# Patient Record
Sex: Female | Born: 1970 | Race: White | Hispanic: No | Smoking: Current every day smoker
Health system: Southern US, Community
[De-identification: ages and names within clinical notes are randomized; demographics above are authoritative.]

## PROBLEM LIST (undated history)

## (undated) DIAGNOSIS — M419 Scoliosis, unspecified: Secondary | ICD-10-CM

## (undated) DIAGNOSIS — M797 Fibromyalgia: Secondary | ICD-10-CM

---

## 2016-10-12 ENCOUNTER — Encounter (HOSPITAL_COMMUNITY): Payer: Self-pay

## 2016-10-12 ENCOUNTER — Emergency Department (HOSPITAL_COMMUNITY): Payer: Medicaid - Out of State

## 2016-10-12 DIAGNOSIS — S299XXA Unspecified injury of thorax, initial encounter: Secondary | ICD-10-CM | POA: Diagnosis present

## 2016-10-12 DIAGNOSIS — Y92000 Kitchen of unspecified non-institutional (private) residence as  the place of occurrence of the external cause: Secondary | ICD-10-CM | POA: Diagnosis not present

## 2016-10-12 DIAGNOSIS — F17200 Nicotine dependence, unspecified, uncomplicated: Secondary | ICD-10-CM | POA: Insufficient documentation

## 2016-10-12 DIAGNOSIS — Y999 Unspecified external cause status: Secondary | ICD-10-CM | POA: Insufficient documentation

## 2016-10-12 DIAGNOSIS — W07XXXA Fall from chair, initial encounter: Secondary | ICD-10-CM | POA: Diagnosis not present

## 2016-10-12 DIAGNOSIS — S2241XA Multiple fractures of ribs, right side, initial encounter for closed fracture: Secondary | ICD-10-CM | POA: Diagnosis not present

## 2016-10-12 DIAGNOSIS — Y9389 Activity, other specified: Secondary | ICD-10-CM | POA: Diagnosis not present

## 2016-10-12 NOTE — ED Triage Notes (Signed)
Pt states she was on a chair reaching up to her cabinets when the chair tipped over and she fell against a counter.  Pt fell against her right ribcage.

## 2016-10-13 ENCOUNTER — Emergency Department (HOSPITAL_COMMUNITY): Payer: Medicaid - Out of State

## 2016-10-13 ENCOUNTER — Emergency Department (HOSPITAL_COMMUNITY)
Admission: EM | Admit: 2016-10-13 | Discharge: 2016-10-13 | Disposition: A | Discharge status: Home / Self Care | Payer: Medicaid - Out of State | Attending: Emergency Medicine | Admitting: Emergency Medicine

## 2016-10-13 DIAGNOSIS — S2241XA Multiple fractures of ribs, right side, initial encounter for closed fracture: Secondary | ICD-10-CM

## 2016-10-13 HISTORY — DX: Scoliosis, unspecified: M41.9

## 2016-10-13 HISTORY — DX: Fibromyalgia: M79.7

## 2016-10-13 MED ORDER — OXYCODONE-ACETAMINOPHEN 5-325 MG PO TABS
1.0000 | ORAL_TABLET | Freq: Once | ORAL | Status: AC
  Administered 2016-10-13: 1 via ORAL
  Filled 2016-10-13: qty 1

## 2016-10-13 MED ORDER — OXYCODONE-ACETAMINOPHEN 5-325 MG PO TABS: 1.0000 | ORAL_TABLET | ORAL | 0 refills | Status: AC | PRN

## 2016-10-13 NOTE — ED Provider Notes (Signed)
[facility] Provider Note   CSN: [id number] Arrival date & time: 10/12/16  2253     History   Chief Complaint Chief Complaint  Patient presents with  . Rib Injury    HPI Adrienne Dyer is a 46 y.o. female presenting for evaluation of right chest wall pain which occurred suddenly when she fell off a chair while trying to reach a bowl on a top shelf in her kitchen cupboard.  The fall occurred prior to arrival tonight, hitting her right chest wall on the counter edge, then landing on her buttocks on the floor. She denies head injury or loc. She has chronic neck and back pain and her neck is hurting more since the fall. She denies numbness, weakness in her extremities, also denies sob except for pain limiting her desire to take a deep breath. She has had no medicines prior to arrival here. HPI  Past Medical History:  Diagnosis Date  . Fibromyalgia   . Scoliosis     There are no active problems to display for this patient.   History reviewed. No pertinent surgical history.  OB History    No data available       Home Medications    Prior to Admission medications   Medication Sig Start Date End Date Taking? Authorizing Provider  oxyCODONE-acetaminophen (PERCOCET/ROXICET) 5-325 MG tablet Take 1 tablet by mouth every 4 (four) hours as needed. 10/13/16   Burgess Amor, PA-C    Family History No family history on file.  Social History Social History  Substance Use Topics  . Smoking status: Current Every Day Smoker  . Smokeless tobacco: Never Used  . Alcohol use Yes     Allergies   Cephalexin   Review of Systems Review of Systems  HENT: Negative.   Eyes: Negative for visual disturbance.  Respiratory: Negative for shortness of breath.   Cardiovascular: Positive for chest pain.  Gastrointestinal: Negative for nausea and vomiting.  Musculoskeletal: Positive for arthralgias. Negative for joint swelling and myalgias.  Skin: Negative.   Neurological: Negative for  dizziness, weakness, numbness and headaches.     Physical Exam Updated Vital Signs BP 129/69 (BP Location: Right Arm)   Pulse 91   Temp 98.2 F (36.8 C) (Oral)   Resp 20   Ht 5\' 2"  (1.575 m)   Wt 64.9 kg (143 lb)   SpO2 100%   BMI 26.16 kg/m   Physical Exam  Constitutional: She appears well-developed and well-nourished.  HENT:  Head: Normocephalic and atraumatic.  Eyes: Conjunctivae are normal.  Neck: Normal range of motion. Neck supple. Spinous process tenderness present.  ttp right lateral cervical spine at the C6-7 level.  Right trapezius muscle spasm noted.  Cardiovascular: Normal rate, regular rhythm, normal heart sounds and intact distal pulses.   Pulmonary/Chest: Effort normal and breath sounds normal. No respiratory distress. She has no decreased breath sounds. She has no wheezes. She has no rhonchi. She has no rales. She exhibits no swelling.    Abdominal: Soft. Bowel sounds are normal. There is no tenderness.  Musculoskeletal: Normal range of motion.  Neurological: She is alert.  Skin: Skin is warm and dry.  Psychiatric: She has a normal mood and affect.  Nursing note and vitals reviewed.    ED Treatments / Results  Labs (all labs ordered are listed, but only abnormal results are displayed) Labs Reviewed - No data to display  EKG  EKG Interpretation None       Radiology Dg Ribs Unilateral W/chest  Right  Result Date: 10/12/2016 CLINICAL DATA:  Status post fall toward right side onto edge of kitchen sink, with right lower rib pain. Initial encounter. EXAM: RIGHT RIBS AND CHEST - 3+ VIEW COMPARISON:  None. FINDINGS: There appear to be mildly displaced fractures of the right anterolateral fourth through sixth ribs. The lungs are well-aerated and clear. There is no evidence of focal opacification, pleural effusion or pneumothorax. The cardiomediastinal silhouette is within normal limits. No additional osseous abnormalities are seen. Thoracic spinal fusion  hardware is noted. IMPRESSION: 1. Apparent mildly displaced fractures of the right anterolateral fourth through sixth ribs. 2. No acute cardiopulmonary process seen. Electronically Signed   By: Roanna RaiderJeffery  Chang M.D.   On: 10/12/2016 23:58   Dg Cervical Spine Complete  Result Date: 10/13/2016 CLINICAL DATA:  Initial evaluation for acute trauma, fall. EXAM: CERVICAL SPINE - COMPLETE 4+ VIEW COMPARISON:  None. FINDINGS: Straightening with slight reversal of the normal cervical lordosis. No listhesis. Vertebral body heights maintained. Tiny osseous density at the anterior aspect of the inferior plate of C5 demonstrates corticated margins, and is felt to be chronic in nature. No acute fracture. Normal C1-2 articulations preserved in the dens is intact. No significant degenerative changes within the cervical spine. Prevertebral soft tissues normal. Harrington fixation rods partially visualize within the upper thoracic spine. Visualized lung apices are clear. IMPRESSION: 1. No radiographic evidence for acute traumatic injury within cervical spine. 2. Tiny osseous density at the anterior aspect of the inferior endplate of C5 demonstrates corticated margins, and is felt to be chronic/degenerative in nature. Electronically Signed   By: Rise MuBenjamin  McClintock M.D.   On: 10/13/2016 01:55    Procedures Procedures (including critical care time)  Medications Ordered in ED Medications  oxyCODONE-acetaminophen (PERCOCET/ROXICET) 5-325 MG per tablet 1 tablet (1 tablet Oral Given 10/13/16 0137)     Initial Impression / Assessment and Plan / ED Course  I have reviewed the triage vital signs and the nursing notes.  Pertinent labs & imaging results that were available during my care of the patient were reviewed by me and considered in my medical decision making (see chart for details).     [place] and [facility] narcotic databases reviewed with no concerning narcotic abuse pattern found.  She was placed on oxycodone for pain sx  associated with injury.  Encouraged continued ibuprofen which she has. activities as tolerated Incentive spirometer.  F/u with pcp for a recheck for any probs/pain/ sob/fevers.   Final Clinical Impressions(s) / ED Diagnoses   Final diagnoses:  Closed fracture of multiple ribs of right side, initial encounter    New Prescriptions New Prescriptions   OXYCODONE-ACETAMINOPHEN (PERCOCET/ROXICET) 5-325 MG TABLET    Take 1 tablet by mouth every 4 (four) hours as needed.     Burgess Amordol, Luccia Reinheimer, PA-C 10/13/16 [id number]    Devoria AlbeKnapp, Iva, MD 10/13/16 [phone]

## 2016-10-13 NOTE — Discharge Instructions (Signed)
Use the incentive spirometer as instructed to help minimize the risk of developing pneumonia.  You may take the oxycodone prescribed for pain relief.  This will make you drowsy - do not drive within 4 hours of taking this medication.  Get rechecked for any worsening pain, shortness of breath, fevers, dizziness or weakness.  Your injury will probably take 4-6 weeks to heal completely.

## 2016-10-20 ENCOUNTER — Encounter (HOSPITAL_COMMUNITY): Payer: Self-pay | Admitting: *Deleted

## 2016-10-20 ENCOUNTER — Emergency Department (HOSPITAL_COMMUNITY)
Admission: EM | Admit: 2016-10-20 | Discharge: 2016-10-20 | Disposition: A | Discharge status: Home / Self Care | Payer: Medicaid - Out of State | Attending: Emergency Medicine | Admitting: Emergency Medicine

## 2016-10-20 DIAGNOSIS — W1839XD Other fall on same level, subsequent encounter: Secondary | ICD-10-CM | POA: Insufficient documentation

## 2016-10-20 DIAGNOSIS — S299XXD Unspecified injury of thorax, subsequent encounter: Secondary | ICD-10-CM | POA: Diagnosis present

## 2016-10-20 DIAGNOSIS — F17200 Nicotine dependence, unspecified, uncomplicated: Secondary | ICD-10-CM | POA: Insufficient documentation

## 2016-10-20 DIAGNOSIS — S2241XD Multiple fractures of ribs, right side, subsequent encounter for fracture with routine healing: Secondary | ICD-10-CM | POA: Diagnosis not present

## 2016-10-20 MED ORDER — HYDROCODONE-ACETAMINOPHEN 7.5-325 MG PO TABS: 1.0000 | ORAL_TABLET | Freq: Four times a day (QID) | ORAL | 0 refills | Status: AC | PRN

## 2016-10-20 NOTE — ED Provider Notes (Signed)
[facility] Provider Note   CSN: [id number] Arrival date & time: 10/20/16  1647     History   Chief Complaint Chief Complaint  Patient presents with  . Rib Fracture    HPI Adrienne Dyer is a 46 y.o. female.  HPI   Adrienne Dyer is a 46 y.o. female who presents to the Emergency Department complaining of persistent right rib pain.  She was seen here last week and treated for same after a mechanical fall landing on the edge of a counter.  She reports continued pain associated with movement, cough, laughing and deep breathing.  Pain improves at rest.  She was prescribed pain medication, but ran out for the medication one day prior.  Pain unrelieved with OTC analgesics.  She denies shortness of breath, abdominal pain, or fever.    Past Medical History:  Diagnosis Date  . Fibromyalgia   . Scoliosis     There are no active problems to display for this patient.   History reviewed. No pertinent surgical history.  OB History    No data available       Home Medications    Prior to Admission medications   Medication Sig Start Date End Date Taking? Authorizing Provider  oxyCODONE-acetaminophen (PERCOCET/ROXICET) 5-325 MG tablet Take 1 tablet by mouth every 4 (four) hours as needed. 10/13/16   Burgess AmorIdol, Julie, PA-C    Family History No family history on file.  Social History Social History  Substance Use Topics  . Smoking status: Current Every Day Smoker  . Smokeless tobacco: Never Used  . Alcohol use Yes     Allergies   Cephalexin   Review of Systems Review of Systems  Constitutional: Negative for chills and fever.  Respiratory: Negative for cough, chest tightness and shortness of breath.   Cardiovascular: Positive for chest pain (right rib pain).  Gastrointestinal: Negative for abdominal distention, abdominal pain and vomiting.  Genitourinary: Negative for difficulty urinating, dysuria, flank pain and hematuria.  Musculoskeletal: Negative for arthralgias,  joint swelling and neck pain.  Skin: Negative for color change and wound.  All other systems reviewed and are negative.    Physical Exam Updated Vital Signs BP (!) 113/59   Pulse 70   Temp 98.8 F (37.1 C)   Resp 20   Ht 5\' 2"  (1.575 m)   Wt 64.9 kg (143 lb)   SpO2 99%   BMI 26.16 kg/m   Physical Exam  Constitutional: She is oriented to person, place, and time. She appears well-developed and well-nourished. No distress.  HENT:  Head: Atraumatic.  Neck: Normal range of motion.  Cardiovascular: Normal rate, regular rhythm, normal heart sounds and intact distal pulses.   Pulmonary/Chest: Effort normal and breath sounds normal. No respiratory distress. She exhibits tenderness (ttp of the lateral right ribs.  no crepitus. ).  Abdominal: Soft. She exhibits no distension. There is no tenderness.  Musculoskeletal: Normal range of motion.  Neurological: She is alert and oriented to person, place, and time.  Skin: Skin is warm and dry. Capillary refill takes less than 2 seconds.  Psychiatric: She has a normal mood and affect.  Nursing note and vitals reviewed.    ED Treatments / Results  Labs (all labs ordered are listed, but only abnormal results are displayed) Labs Reviewed - No data to display  EKG  EKG Interpretation None       Radiology No results found.  Procedures Procedures (including critical care time)  Medications Ordered in ED Medications - No  data to display   Initial Impression / Assessment and Plan / ED Course  I have reviewed the triage vital signs and the nursing notes.  Pertinent labs & imaging results that were available during my care of the patient were reviewed by me and considered in my medical decision making (see chart for details).     Pt reviewed on the [facility].   Pt with documented rib fx's from 10/12/16. Vitals stable.  Lungs clear.  Vitals stable.  Pt appears stable for d/c.  Will provide a short course of pain medication and she  understands that will need to f/u with PCP for further management.  Pt agrees to plan..  Final Clinical Impressions(s) / ED Diagnoses   Final diagnoses:  Closed fracture of multiple ribs of right side with routine healing, subsequent encounter    New Prescriptions Discharge Medication List as of 10/20/2016  6:00 PM    START taking these medications   Details  HYDROcodone-acetaminophen (NORCO) 7.5-325 MG tablet Take 1 tablet by mouth every 6 (six) hours as needed for moderate pain., Starting Thu 10/20/2016, Print         Willella Harding, PA-C 10/22/16 2331    Samuel Jester, DO 10/24/16 2102

## 2016-10-20 NOTE — Discharge Instructions (Signed)
Continue to take your ibuprofen 800 mg as directed.  Also continue using your incentive spirometer several times a day.  Follow-up with your doctor for recheck

## 2016-10-20 NOTE — ED Triage Notes (Signed)
Seen her last week and diagnosed with rib fracture, here for pain medication

## 2016-10-26 ENCOUNTER — Encounter (HOSPITAL_COMMUNITY): Payer: Self-pay | Admitting: Emergency Medicine

## 2016-10-26 ENCOUNTER — Emergency Department (HOSPITAL_COMMUNITY)
Admission: EM | Admit: 2016-10-26 | Discharge: 2016-10-26 | Disposition: A | Discharge status: Home / Self Care | Payer: Medicaid - Out of State | Attending: Emergency Medicine | Admitting: Emergency Medicine

## 2016-10-26 DIAGNOSIS — S2241XD Multiple fractures of ribs, right side, subsequent encounter for fracture with routine healing: Secondary | ICD-10-CM

## 2016-10-26 DIAGNOSIS — S0003XA Contusion of scalp, initial encounter: Secondary | ICD-10-CM | POA: Diagnosis not present

## 2016-10-26 DIAGNOSIS — Y939 Activity, unspecified: Secondary | ICD-10-CM | POA: Diagnosis not present

## 2016-10-26 DIAGNOSIS — F17210 Nicotine dependence, cigarettes, uncomplicated: Secondary | ICD-10-CM | POA: Diagnosis not present

## 2016-10-26 DIAGNOSIS — Y999 Unspecified external cause status: Secondary | ICD-10-CM | POA: Insufficient documentation

## 2016-10-26 DIAGNOSIS — W228XXA Striking against or struck by other objects, initial encounter: Secondary | ICD-10-CM | POA: Diagnosis not present

## 2016-10-26 DIAGNOSIS — Y92014 Private driveway to single-family (private) house as the place of occurrence of the external cause: Secondary | ICD-10-CM | POA: Diagnosis not present

## 2016-10-26 MED ORDER — IBUPROFEN 800 MG PO TABS: 800.0000 mg | ORAL_TABLET | Freq: Three times a day (TID) | ORAL | 0 refills | Status: AC

## 2016-10-26 MED ORDER — IBUPROFEN 800 MG PO TABS
800.0000 mg | ORAL_TABLET | Freq: Once | ORAL | Status: AC
  Administered 2016-10-26: 800 mg via ORAL
  Filled 2016-10-26: qty 1
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
## 2016-10-26 NOTE — Discharge Instructions (Signed)
Apply ice packs on/off to your scalp.  Follow-up with your primary doctor for recheck
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
## 2016-10-26 NOTE — ED Notes (Signed)
Ice pack given to pt.

## 2018-12-20 IMAGING — DX DG CERVICAL SPINE COMPLETE 4+V
5 series · 5 of 5 positions shown · non-contrast
Comparison: None.

CLINICAL DATA: Initial evaluation for acute trauma, fall.

EXAM:
CERVICAL SPINE - COMPLETE 4+ VIEW

[c-spine lat]
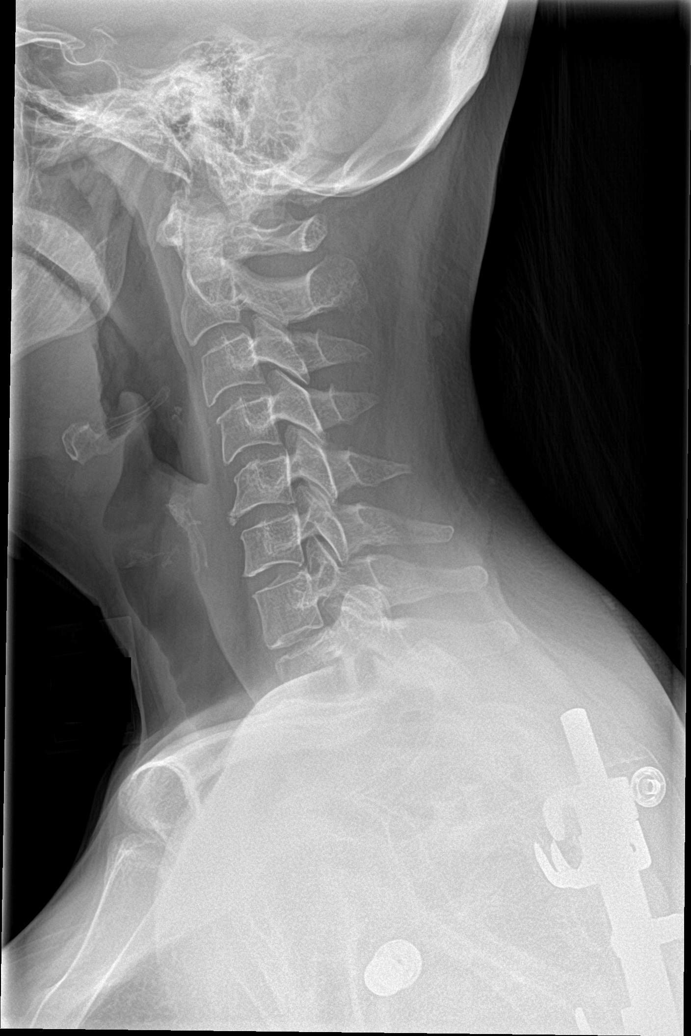

[c-spine obl (1 of 2)]
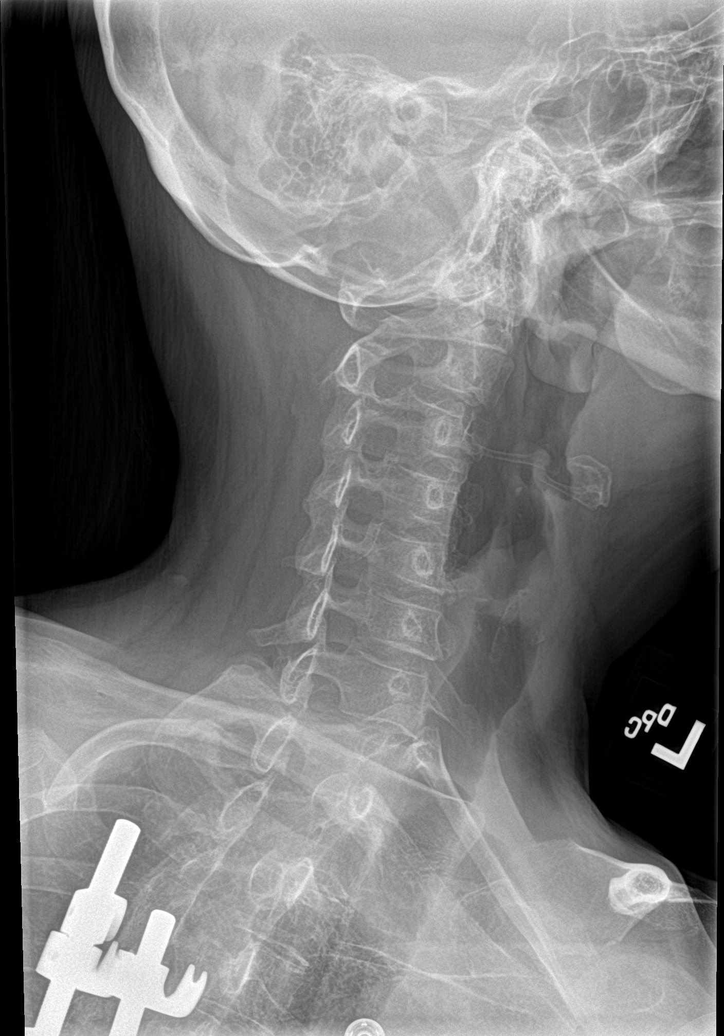

[c-spine obl (2 of 2)]
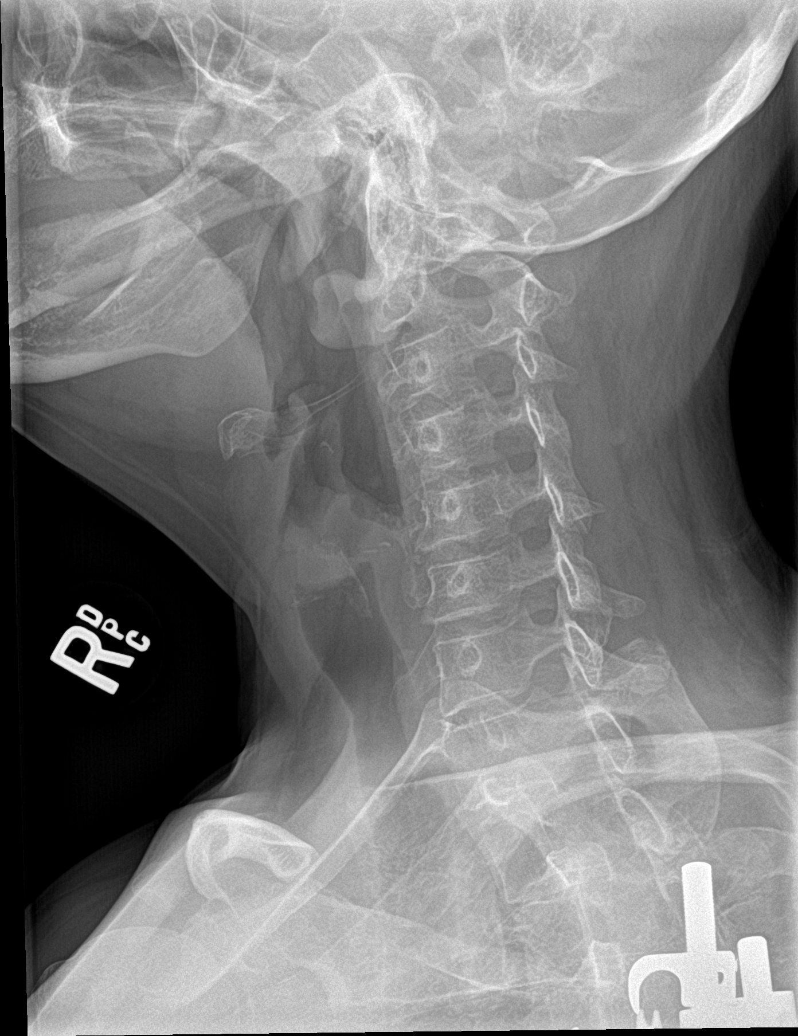

[c-spine ap]
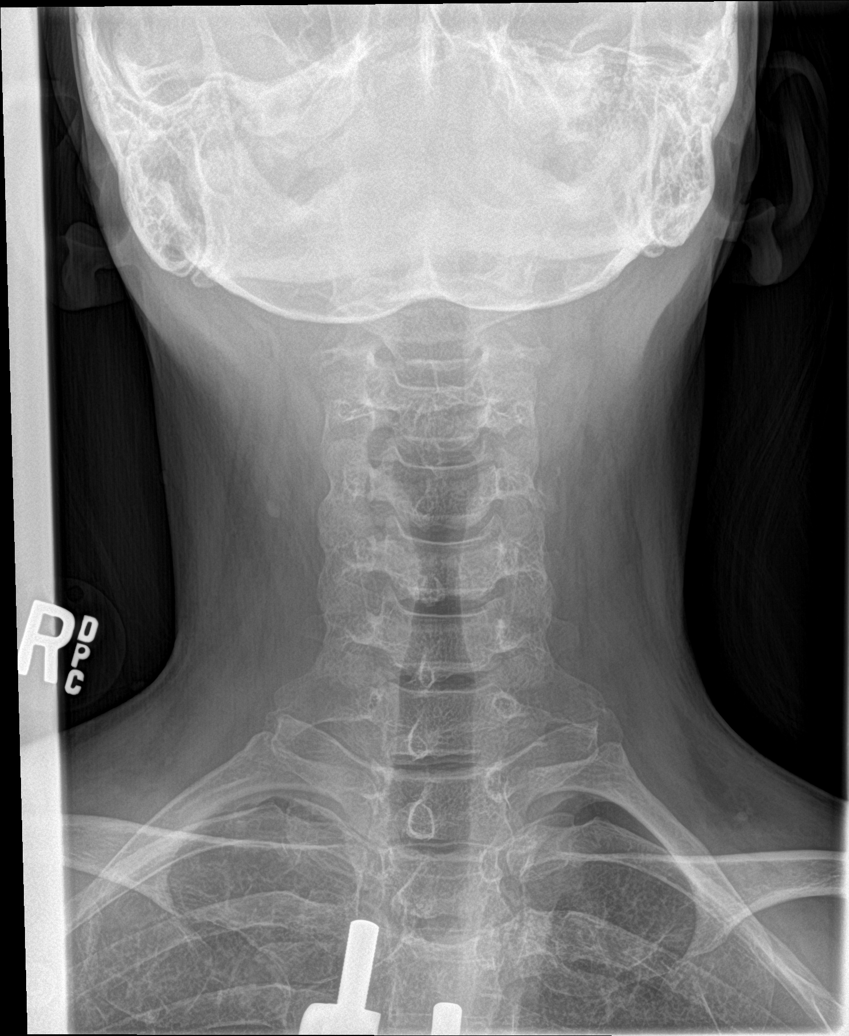

[c-spine open mouth]
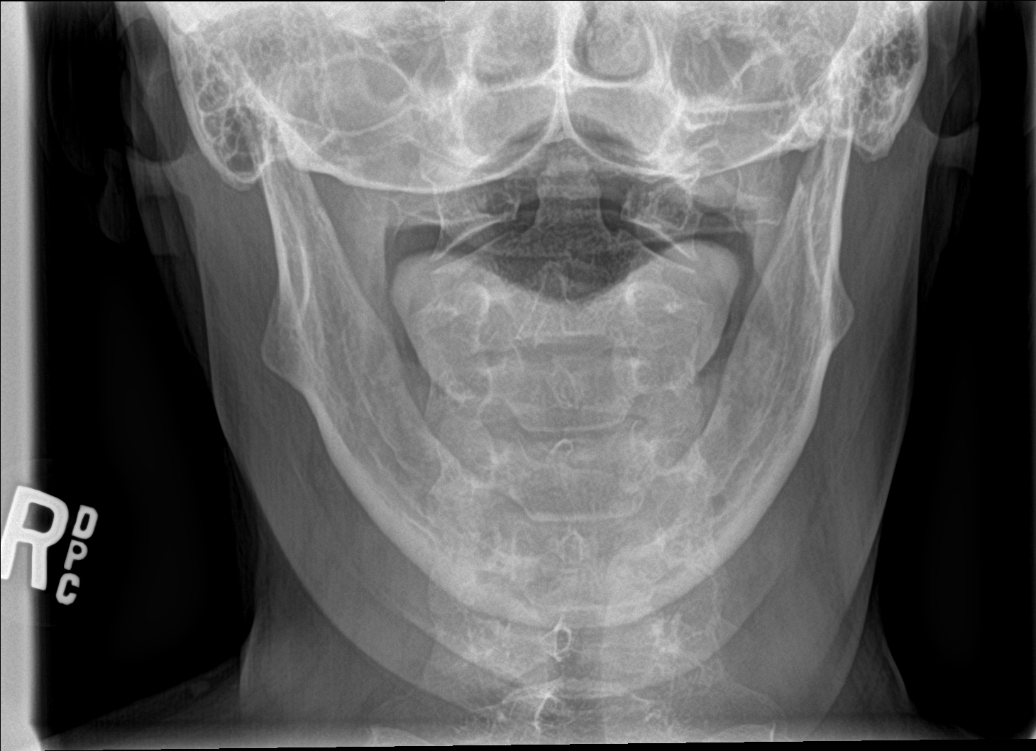

[5 of 5 positions shown; findings below may reference images not displayed]

FINDINGS: Straightening with slight reversal of the normal cervical lordosis.
No listhesis. Vertebral body heights maintained. Tiny osseous
density at the anterior aspect of the inferior plate of C5
demonstrates corticated margins, and is felt to be chronic in
nature. No acute fracture. Normal C1-2 articulations preserved in
the dens is intact. No significant degenerative changes within the
cervical spine. Prevertebral soft tissues normal. Harrington
fixation rods partially visualize within the upper thoracic spine.

Visualized lung apices are clear.
IMPRESSION: 1. No radiographic evidence for acute traumatic injury within
cervical spine.
2. Tiny osseous density at the anterior aspect of the inferior
endplate of C5 demonstrates corticated margins, and is felt to be
chronic/degenerative in nature.
